# Patient Record
Sex: Female | Born: 1960 | Race: White | Hispanic: No | Smoking: Never smoker
Health system: Southern US, Community
[De-identification: ages and names within clinical notes are randomized; demographics above are authoritative.]

## PROBLEM LIST (undated history)

## (undated) DIAGNOSIS — E785 Hyperlipidemia, unspecified: Secondary | ICD-10-CM

## (undated) DIAGNOSIS — J45909 Unspecified asthma, uncomplicated: Secondary | ICD-10-CM

## (undated) DIAGNOSIS — C801 Malignant (primary) neoplasm, unspecified: Secondary | ICD-10-CM

## (undated) DIAGNOSIS — I10 Essential (primary) hypertension: Secondary | ICD-10-CM

## (undated) HISTORY — DX: Hyperlipidemia, unspecified: E78.5

## (undated) HISTORY — PX: CHOLECYSTECTOMY: SHX55

## (undated) HISTORY — PX: FOOT SURGERY: SHX648

## (undated) HISTORY — DX: Essential (primary) hypertension: I10

## (undated) HISTORY — DX: Malignant (primary) neoplasm, unspecified: C80.1

## (undated) HISTORY — PX: OVARY SURGERY: SHX727

## (undated) HISTORY — DX: Unspecified asthma, uncomplicated: J45.909

---

## 2009-08-07 ENCOUNTER — Encounter: Admission: RE | Admit: 2009-08-07 | Discharge: 2009-08-07 | Payer: Self-pay | Admitting: Family Medicine

## 2010-01-06 ENCOUNTER — Encounter: Payer: Self-pay | Admitting: Orthopedic Surgery

## 2010-01-06 ENCOUNTER — Emergency Department (HOSPITAL_COMMUNITY): Admission: EM | Admit: 2010-01-06 | Discharge: 2010-01-06 | Payer: Self-pay | Admitting: Emergency Medicine

## 2010-01-14 ENCOUNTER — Ambulatory Visit: Payer: Self-pay | Admitting: Orthopedic Surgery

## 2010-01-14 ENCOUNTER — Encounter (INDEPENDENT_AMBULATORY_CARE_PROVIDER_SITE_OTHER): Payer: Self-pay | Admitting: *Deleted

## 2010-01-14 DIAGNOSIS — M23302 Other meniscus derangements, unspecified lateral meniscus, unspecified knee: Secondary | ICD-10-CM | POA: Insufficient documentation

## 2010-01-14 DIAGNOSIS — M25569 Pain in unspecified knee: Secondary | ICD-10-CM | POA: Insufficient documentation

## 2010-01-14 DIAGNOSIS — IMO0002 Reserved for concepts with insufficient information to code with codable children: Secondary | ICD-10-CM | POA: Insufficient documentation

## 2010-01-21 ENCOUNTER — Telehealth (INDEPENDENT_AMBULATORY_CARE_PROVIDER_SITE_OTHER): Payer: Self-pay | Admitting: *Deleted

## 2010-01-23 ENCOUNTER — Telehealth: Payer: Self-pay | Admitting: Orthopedic Surgery

## 2010-01-27 ENCOUNTER — Telehealth: Payer: Self-pay | Admitting: Orthopedic Surgery

## 2010-06-06 ENCOUNTER — Emergency Department (HOSPITAL_COMMUNITY): Admission: EM | Admit: 2010-06-06 | Discharge: 2010-06-06 | Payer: Self-pay | Admitting: Emergency Medicine

## 2010-12-20 ENCOUNTER — Encounter: Payer: Self-pay | Admitting: Orthopedic Surgery

## 2010-12-29 NOTE — Miscellaneous (Signed)
Summary: mri knee aph 01/20/10  Clinical Lists Changes  precert number [id number] [place] medicaid expires 02/13/10 case number [id number], mri aph right knee 01/20/10 reg at 830am, to bring disc to fu appt in our office on 01/26/10 at 4pm

## 2010-12-29 NOTE — Assessment & Plan Note (Signed)
Summary: [facility] RT KNEE PAIN XR [place]/BSF   Vital Signs:  Patient profile:   50 year old female Weight:      285 pounds Pulse rate:   82 / minute Resp:     18 per minute  Vitals Entered By: Fuller Canada MD (January 14, 2010 10:30 AM)  Visit Type:  initial viist  Referring Provider:  [facility] Primary Provider:  Dr. Thayer Headings  CC:  right knee injury.Marland Kitchen  History of Present Illness: I saw Yesenia Oliver in the office today for an initial visit.  She is a 50 years old woman with the complaint of:  right knee injury.  Xrays [facility] right knee on 01/06/10.  Larey Seat 01/04/10 into a hole, her knee went forward and buckled on her. she is complaining of severe RIGHT knee pain with swelling decreased range of motion and inability to bear weight  Percocet 10 from PCP helps.  Meds: Lasix, Actos, Lisinopril, Paxil, Potassium chl, Hydroxine, Flexeril, Trazadone, Percocet 10 from PCP.    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  disability no smoking, drinking or caffeine use  Review of Systems General:  Denies weight loss, weight gain, fever, chills, and fatigue. Cardiac :  Denies chest pain, angina, heart attack, heart failure, poor circulation, blood clots, and phlebitis. Resp:  Complains of cough; denies short of breath, difficulty breathing, COPD, and pneumonia; wheezing. GI:  Denies nausea, vomiting, diarrhea, constipation, difficulty swallowing, ulcers, GERD, and reflux. GU:  Denies kidney failure, kidney transplant, kidney stones, burning, poor stream, testicular cancer, blood in urine, and . Neuro:  Denies headache, dizziness, migraines, numbness, weakness,  tremor, and unsteady walking. MS:  Complains of joint pain and joint swelling; denies rheumatoid arthritis, gout, bone cancer, osteoporosis, and ; stiffness and heat. Endo:  Denies thyroid disease, goiter, and diabetes. Psych:  Complains of depression; denies mood swings, anxiety, panic attack, bipolar, and schizophrenia. Derm:  Denies eczema, cancer, and itching. EENT:  Denies poor vision, cataracts, glaucoma, poor hearing, vertigo, ears ringing, sinusitis, hoarseness, toothaches, and bleeding gums; headaches. Immunology:  Denies seasonal allergies, sinus problems, and allergic to bee stings. Lymphatic:  Denies lymph node cancer and lymph edema.  Physical Exam  Additional Exam:  she is definitely a large female vital signs are as recorded her grooming is normal her nutrition is poor because of her weight her development is normal there are no deformities  She tends to carry some fluid and edema and swelling in both lower extremities pulses and temperature normal  Her groin was devoid of any lymphadenopathy  Her skin is normal in both lower extremities  Her reflexes are 2+  Her sensation is normal  She was oriented to person place and time and her mood is pleasant  Her LEFT knee had full range of motion normal strength no joint laxity and no tenderness  The RIGHT knee had a small effusion range of motion 0-90 passively this was painful the knee was stable to varus valgus and anterior-posterior stress muscle strength and tone were normal.  McMurray sign seem to be positive with reproduction of medial knee pain along the joint line and clicking.  She ambulated poorly with a limp in no supportive devices   Impression &  Recommendations:  Problem # 1:  KNEE SPRAIN (ICD-844.9)  The x-rays were done at [facility]. The report and the films have been reviewed. 4 views   tricompartment OA-mild    New Patient Level III (41324)  Problem # 2:  DERANGEMENT MENISCUS (ICD-717.5)  Orders: New  Patient Level III (40102)  Problem # 3:  KNEE PAIN (ICD-719.46)  most likely a sprain of one of the ligaments with possibility of a cartilage tear  Recommend ice, bracing, support for gait, pain medicine and anti-inflammatory.  MRI rule out surgical lesion and then treat appropriately  Orders: New Patient Level III (72536)  Her updated medication list for this problem includes:    Percocet 5-325 Mg Tabs (Oxycodone-acetaminophen) ..... One q.4 p.r.n. pain    Ibuprofen 800 Mg Tabs (Ibuprofen) ..... One q. 8 hours p.r.n. pain  Medications Added to Medication List This Visit: 1)  Percocet 5-325 Mg Tabs (Oxycodone-acetaminophen) .... One q.4 p.r.n. pain 2)  Ibuprofen 800 Mg Tabs (Ibuprofen) .... One q. 8 hours p.r.n. pain  Patient Instructions: 1)  Sprained knee  2)  MRI right knee  3)  take pain medicine and antiinflammatory  4)  wear brace when walking  5)  when not walking bend your knee 25x,  3 x a day  6)  apply ice three times a day for 30 minutes  7)  use crutches  Prescriptions: IBUPROFEN 800 MG TABS (IBUPROFEN) one q. 8 hours p.r.n. pain  #90 x 0   Entered and Authorized by:   Fuller Canada MD   Signed by:   Fuller Canada MD on 01/14/2010   Method used:   Handwritten   RxID:   [id number] PERCOCET 5-325 MG TABS (OXYCODONE-ACETAMINOPHEN) one q.4 p.r.n. pain  #42 x 0   Entered and Authorized by:   Fuller Canada MD   Signed by:   Fuller Canada MD on 01/14/2010   Method used:   Handwritten   RxID:   [id number]

## 2010-12-29 NOTE — Progress Notes (Signed)
Summary: wants Percocet prescription  Phone Note Call from Patient   Summary of Call: Yesenia Oliver (2061/03/27) wants new prescription for Percocet Her # [phone] Initial call taken by: Jacklynn Ganong,  January 27, 2010 1:12 PM  Follow-up for Phone Call        No, sorry the percocet was for the first few days   no further meds will be given until the mri is done    Follow-up by: Fuller Canada MD,  January 27, 2010 1:46 PM  Additional Follow-up for Phone Call Additional follow up Details #1::        Advised the patient of doctor's reply Additional Follow-up by: Jacklynn Ganong,  January 27, 2010 2:40 PM

## 2010-12-29 NOTE — Medication Information (Signed)
Summary: Tax adviser   Imported By: Cammie Sickle 05/20/2010 20:46:10  _____________________________________________________________________  External Attachment:    Type:   Image     Comment:   External Document

## 2010-12-29 NOTE — Progress Notes (Signed)
Summary: mri rescheduled after a no show and declination (for 02/04/10 aph)  Phone Note Outgoing Call Call back at Home Phone [phone]   Summary of Call: I called patient to let her know that I rescheduled her MRI appt for March 9th at 840am, [place] advised me that she was supposed to be there at 10am this am for the mri, I advised patient and she said that she had forgotten< I advised patient to keep the March 9th appt, if she did not show for that appt we may not get to reschedule her MRI appt again for a while, DR H to call with results of the MRI rt knee. Initial call taken by: Ether Griffins,  January 23, 2010 10:44 AM

## 2010-12-29 NOTE — Progress Notes (Signed)
Summary: did not get MRi done  Phone Note Call from Patient   Summary of Call: Yesenia Oliver (March 05, 2061) said she went today for her MRI  but could not get it done because of her facial piercings, and she has just remembered that she has pins in her arm from a break some years back. Her # [phone] Initial call taken by: Jacklynn Ganong,  January 21, 2010 1:21 PM  Follow-up for Phone Call        she would not take her piercings out per request by [facility] tech, any suggestions?  Additional Follow-up for Phone Call Additional follow up Details #1::        she doesn't take them out she will have to live with her knee as it is and she doesn't need to come back to the office Additional Follow-up by: Fuller Canada MD,  January 22, 2010 7:57 AM    Additional Follow-up for Phone Call Additional follow up Details #2::    ok Follow-up by: Ether Griffins,  January 22, 2010 8:55 AM  Additional Follow-up for Phone Call Additional follow up Details #3:: Details for Additional Follow-up Action Taken: I left a voice mail message for patient.  * Patient returned my call.  Advised.  States she wants to re-schedule the MRI at [facility] and understands about removal of piercing. Was concerned about "her whole head being under the MRI".  Said okay with regular MRI at [facility] as long as "head will not be covered up."    * Asked for a date after March 7th. Additional Follow-up by: Cammie Sickle,  January 22, 2010 11:07 AM

## 2010-12-29 NOTE — Letter (Signed)
Summary: History form  History form   Imported By: Jacklynn Ganong 01/15/2010 08:21:48  _____________________________________________________________________  External Attachment:    Type:   Image     Comment:   External Document

## 2012-04-18 ENCOUNTER — Other Ambulatory Visit: Payer: Self-pay | Admitting: Family Medicine

## 2012-04-18 DIAGNOSIS — R102 Pelvic and perineal pain: Secondary | ICD-10-CM

## 2012-04-20 ENCOUNTER — Ambulatory Visit
Admission: RE | Admit: 2012-04-20 | Discharge: 2012-04-20 | Disposition: A | Discharge status: Home / Self Care | Payer: Medicaid Other | Source: Ambulatory Visit | Attending: Family Medicine | Admitting: Family Medicine

## 2012-04-20 DIAGNOSIS — R102 Pelvic and perineal pain: Secondary | ICD-10-CM

## 2012-08-28 ENCOUNTER — Encounter (INDEPENDENT_AMBULATORY_CARE_PROVIDER_SITE_OTHER): Payer: Self-pay | Admitting: Surgery

## 2012-08-31 ENCOUNTER — Ambulatory Visit (INDEPENDENT_AMBULATORY_CARE_PROVIDER_SITE_OTHER): Payer: Medicaid Other | Admitting: Surgery

## 2012-08-31 ENCOUNTER — Encounter (INDEPENDENT_AMBULATORY_CARE_PROVIDER_SITE_OTHER): Payer: Self-pay | Admitting: Surgery

## 2012-08-31 VITALS — BP 118/80 | HR 72 | Temp 98.3°F | Resp 18 | Ht 66.0 in | Wt 235.2 lb

## 2012-08-31 DIAGNOSIS — R1013 Epigastric pain: Secondary | ICD-10-CM | POA: Insufficient documentation

## 2012-08-31 DIAGNOSIS — M6208 Separation of muscle (nontraumatic), other site: Secondary | ICD-10-CM

## 2012-08-31 DIAGNOSIS — M6200 Separation of muscle (nontraumatic), unspecified site: Secondary | ICD-10-CM

## 2012-08-31 MED ORDER — ESOMEPRAZOLE MAGNESIUM 20 MG PO CPDR: 20.0000 mg | DELAYED_RELEASE_CAPSULE | Freq: Every day | ORAL | Status: AC

## 2012-08-31 NOTE — Patient Instructions (Signed)
Call your primary care doctor's office next week to discuss referral to a GI doctor.

## 2012-08-31 NOTE — Progress Notes (Signed)
Patient ID: Yesenia Oliver, female   DOB: February 20, 1961, 52 y.o.   MRN: [id number]  "abdominal hernia"  HPI Yesenia Oliver is a 51 y.o. female.  Referred by Dr. Leilani Able, [facility] for evaluation of "abdominal hernia" HPI This is a 52 year old female with multiple medical problems who presents with a one-month history of severe pain at her epigastric region just below the tip of her xiphoid. She is also has some nausea associated with this. She does have a history of peptic ulcer disease. She has not seen a GI doctor since moving to the area. She is currently not on any acid medication. She was treated empirically for H. Pylori but this did not seem to help. She denies any hematemesis or melena.  Apparently at one of her doctors appointments she was noted to have some bulging in her epigastrium when she sat up. This was felt to be a ventral hernia she is referred for evaluation. She denies any problems with bowel movements.  Past Medical History  Diagnosis Date  . Diabetes mellitus   . Hypertension   . Asthma   . Hyperlipidemia   . Cancer     ovarian    Past Surgical History  Procedure Date  . Cesarean section     x2  . Foot surgery   . Cholecystectomy   . Ovary surgery     Family History  Problem Relation Age of Onset  . Lung cancer Mother   . Kidney cancer Sister     Social History History  Substance Use Topics  . Smoking status: Never Smoker   . Smokeless tobacco: Not on file  . Alcohol Use: No    Allergies  Allergen Reactions  . Naproxen Hives    Current Outpatient Prescriptions  Medication Sig Dispense Refill  . ALPRAZolam (XANAX) 1 MG tablet Take 1 mg by mouth at bedtime as needed.      . budesonide-formoterol (SYMBICORT) 80-4.5 MCG/ACT inhaler Inhale 2 puffs into the lungs 2 (two) times daily.      . clarithromycin (BIAXIN) 500 MG tablet Take 500 mg by mouth 2 (two) times daily.      . furosemide (LASIX) 20 MG tablet Take 20 mg by  mouth 2 (two) times daily.      . hydrochlorothiazide (HYDRODIURIL) 50 MG tablet Take 50 mg by mouth daily.      Marland Kitchen HYDROcodone-acetaminophen (NORCO) 10-325 MG per tablet Take 1 tablet by mouth every 6 (six) hours as needed.      . insulin aspart (NOVOLOG FLEXPEN) 100 UNIT/ML injection Inject into the skin 3 (three) times daily before meals.      . insulin aspart protamine-insulin aspart (NOVOLOG 70/30) (70-30) 100 UNIT/ML injection Inject into the skin.      Marland Kitchen insulin glargine (LANTUS) 100 UNIT/ML injection Inject into the skin at bedtime.      Marland Kitchen PARoxetine (PAXIL) 40 MG tablet Take 40 mg by mouth every morning.      Marland Kitchen esomeprazole (NEXIUM) 20 MG capsule Take 1 capsule (20 mg total) by mouth daily before breakfast.  30 capsule  3    Review of Systems Review of Systems  Constitutional: Negative for fever, chills and unexpected weight change.  HENT: Negative for hearing loss, congestion, sore throat, trouble swallowing and voice change.   Eyes: Negative for visual disturbance.  Respiratory: Negative for cough and wheezing.   Cardiovascular: Negative for chest pain, palpitations and leg swelling.  Gastrointestinal: Positive for nausea, abdominal pain  and abdominal distention. Negative for vomiting, diarrhea, constipation, blood in stool and anal bleeding.  Genitourinary: Negative for hematuria, vaginal bleeding and difficulty urinating.  Musculoskeletal: Negative for arthralgias.  Skin: Negative for rash and wound.  Neurological: Negative for seizures, syncope and headaches.  Hematological: Negative for adenopathy. Does not bruise/bleed easily.  Psychiatric/Behavioral: Negative for confusion.    Blood pressure 118/80, pulse 72, temperature 98.3 F (36.8 C), resp. rate 18, height 5\' 6"  (1.676 m), weight 235 lb 3.2 oz (106.686 kg).  Physical Exam Physical Exam WDWN in NAD HEENT:  EOMI, sclera anicteric Neck:  No masses, no thyromegaly Lungs:  CTA bilaterally; normal respiratory  effort CV:  Regular rate and rhythm; no murmurs Abd:  +bowel sounds, soft, tender in epigastrium; large upper midline rectus diastasis, but no ventral hernia on palpation.  Healed lap cholecystectomy incisions with no hernia Ext:  Well-perfused; no edema Skin:  Warm, dry; no sign of jaundice  Data Reviewed none  Assessment    Epigastric pain - history of peptic ulcer disease; suspicious for gastritis or recurrent peptic ulcer disease Rectus diastasis - asymptomatic; this does not require surgical repair    Plan    No surgery needed for rectus diastasis. The patient needs to be evaluated by a gastroenterologist with probable EGD to rule out gastric etiology for her epigastric pain.  I empirically started her on Nexium today while she is waiting for the GI referral.  She prefers to call her primary care physician to set up the GI referral.        Coye Dawood K. 08/31/2012, 3:02 PM

## 2014-05-20 ENCOUNTER — Other Ambulatory Visit: Payer: Self-pay | Admitting: Family Medicine

## 2014-05-20 ENCOUNTER — Ambulatory Visit
Admission: RE | Admit: 2014-05-20 | Discharge: 2014-05-20 | Disposition: A | Discharge status: Home / Self Care | Payer: Medicaid Other | Source: Ambulatory Visit | Attending: Family Medicine | Admitting: Family Medicine

## 2014-05-20 DIAGNOSIS — R52 Pain, unspecified: Secondary | ICD-10-CM

## 2015-04-15 IMAGING — CR DG KNEE COMPLETE 4+V*R*
4 series · 4 of 4 positions shown · non-contrast
Comparison: January 06, 2010.

CLINICAL DATA: Right knee pain.

EXAM:
RIGHT KNEE - COMPLETE 4+ VIEW

[view not recorded (1 of 4)]
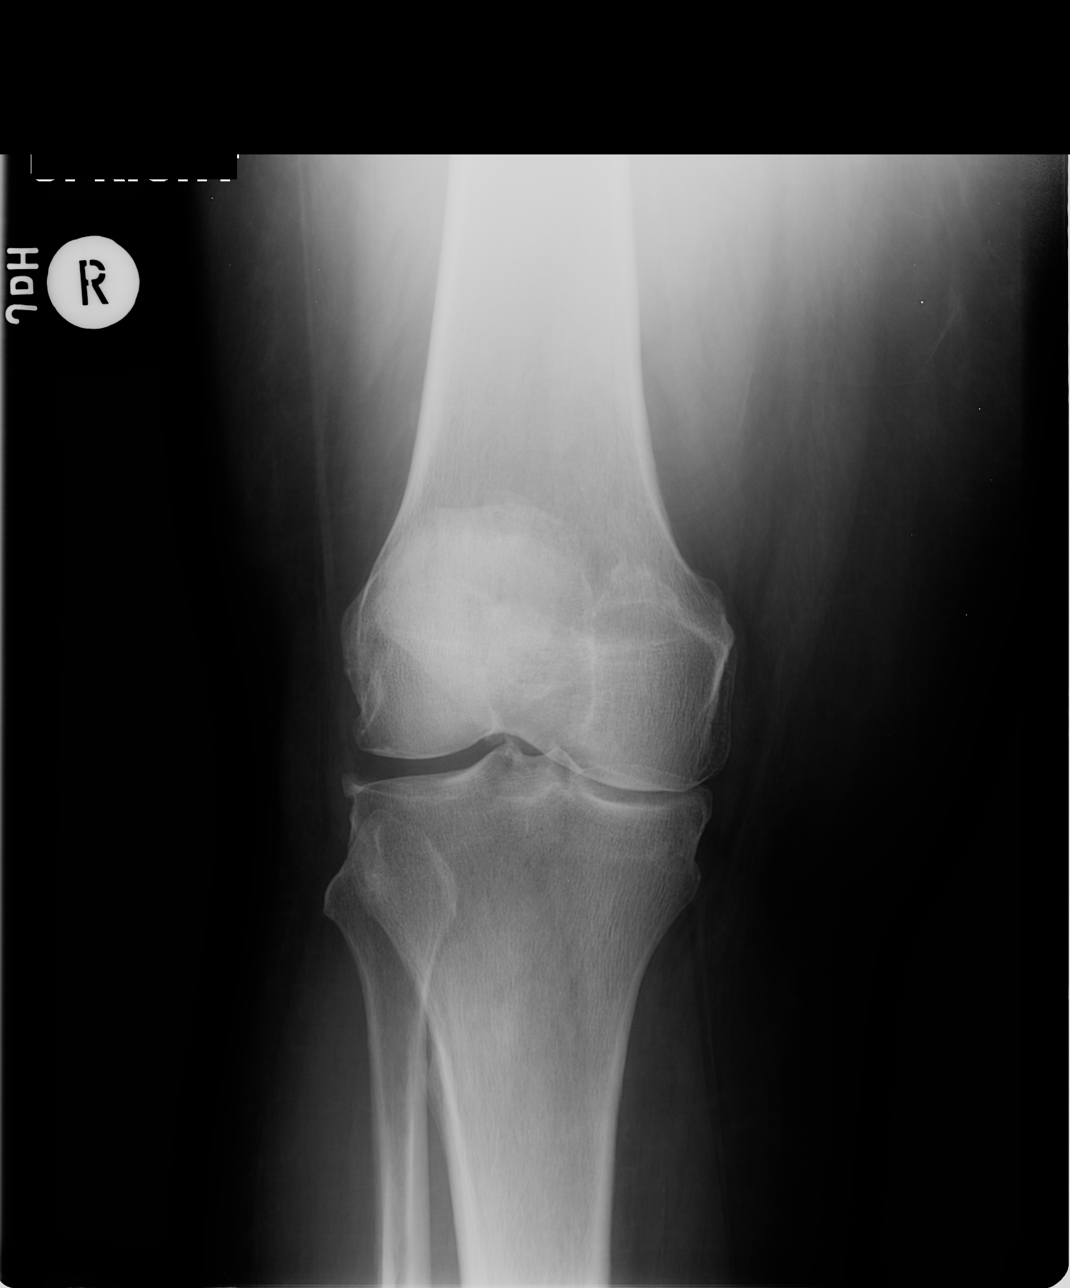

[view not recorded (2 of 4)]
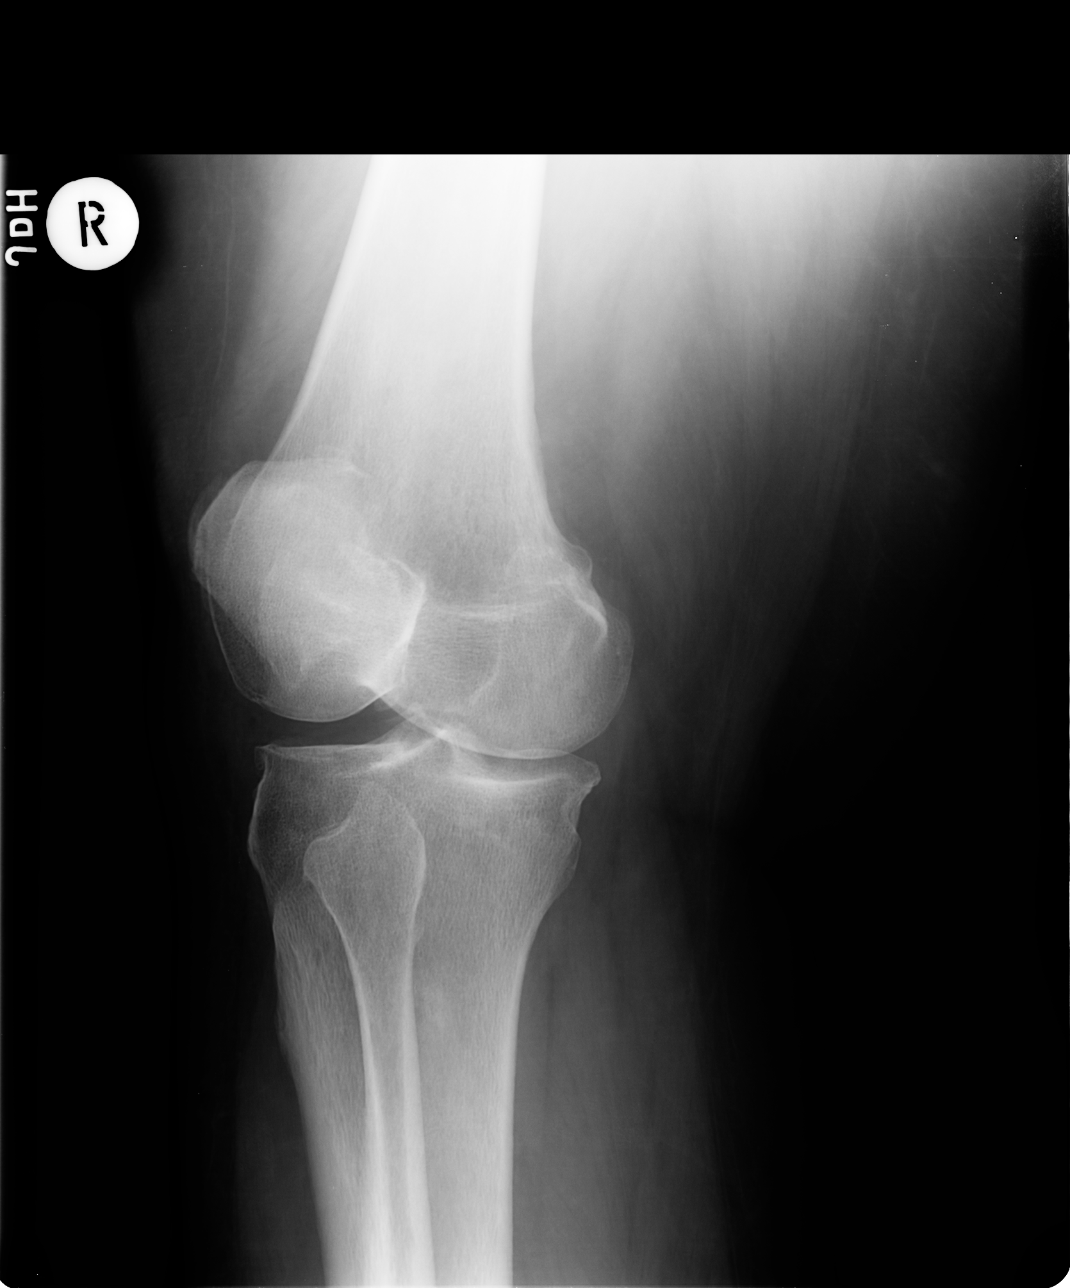

[view not recorded (3 of 4)]
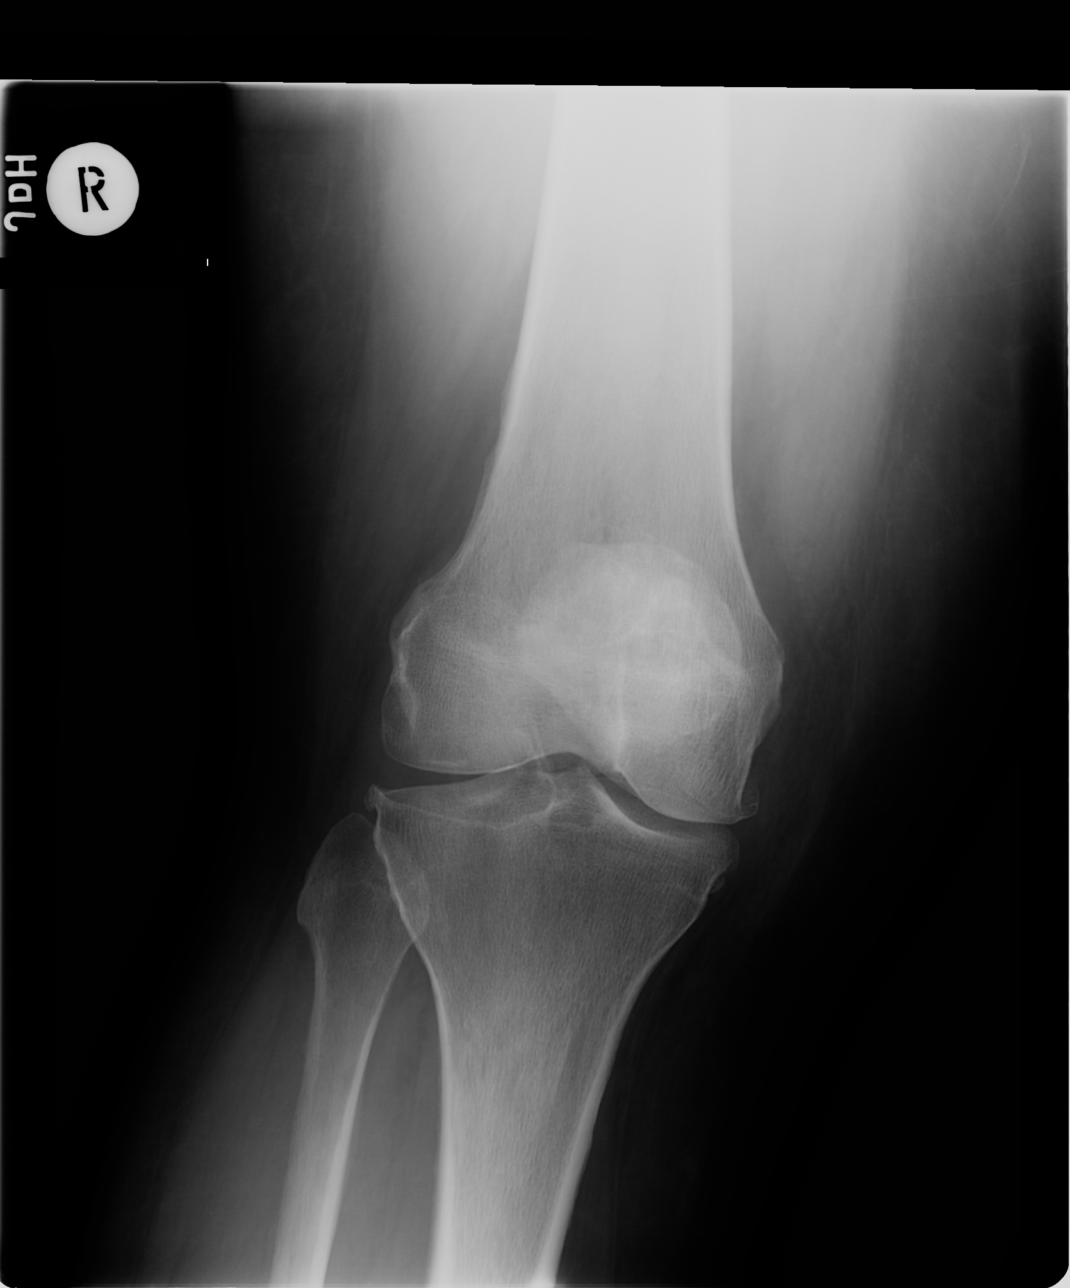

[view not recorded (4 of 4)]
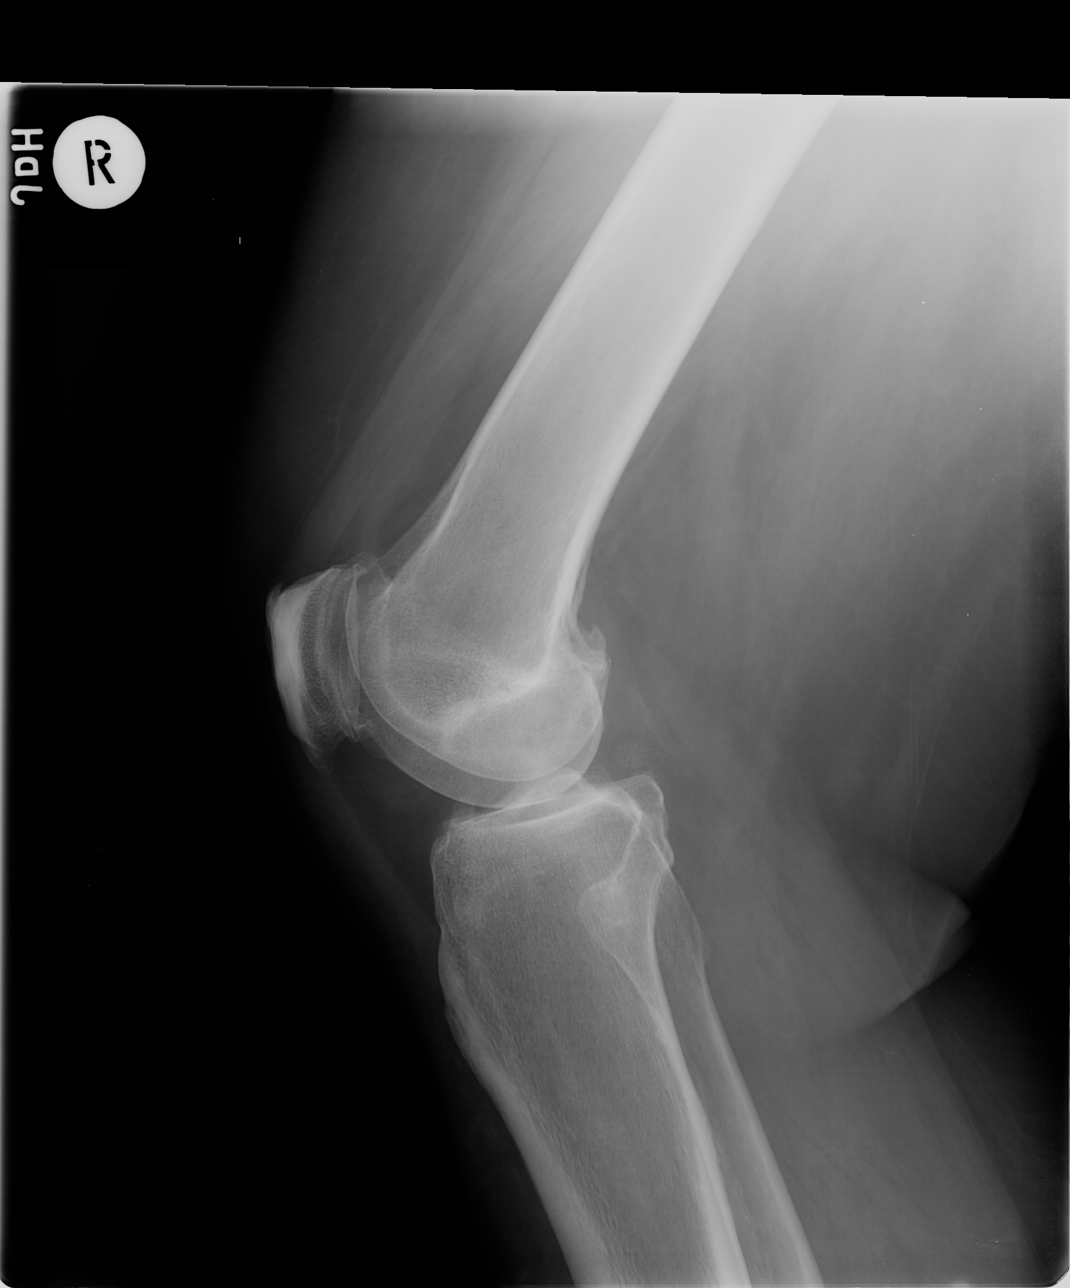

[4 of 4 positions shown; findings below may reference images not displayed]

FINDINGS: There is no evidence of fracture, dislocation, or joint effusion.
Tricompartmental narrowing of the right knee is noted which is most
severely seen in the medial joint space. Soft tissues are
unremarkable.
IMPRESSION: Tricompartmental degenerative joint disease of the right knee. No
acute abnormality seen.

## 2021-09-29 DEATH — deceased
